# Patient Record
Sex: Female | Born: 2000 | Race: Black or African American | Hispanic: No | Smoking: Never smoker
Health system: Southern US, Community
[De-identification: ages and names within clinical notes are randomized; demographics above are authoritative.]

## PROBLEM LIST (undated history)

## (undated) DIAGNOSIS — L309 Dermatitis, unspecified: Secondary | ICD-10-CM

## (undated) DIAGNOSIS — F32A Depression, unspecified: Secondary | ICD-10-CM

## (undated) DIAGNOSIS — F909 Attention-deficit hyperactivity disorder, unspecified type: Secondary | ICD-10-CM

## (undated) DIAGNOSIS — T7840XA Allergy, unspecified, initial encounter: Secondary | ICD-10-CM

## (undated) DIAGNOSIS — F419 Anxiety disorder, unspecified: Secondary | ICD-10-CM

## (undated) HISTORY — DX: Dermatitis, unspecified: L30.9

## (undated) HISTORY — PX: CHOLECYSTECTOMY: SHX55

## (undated) HISTORY — DX: Allergy, unspecified, initial encounter: T78.40XA

---
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
## 2021-04-14 ENCOUNTER — Encounter: Payer: Self-pay | Admitting: Family Medicine

## 2021-04-18 MED ORDER — CETIRIZINE HCL 10 MG PO TABS: 10.0000 mg | ORAL_TABLET | Freq: Every day | ORAL | 3 refills | Status: DC

## 2021-05-20 ENCOUNTER — Encounter: Payer: Self-pay | Admitting: Family Medicine

## 2021-06-01 DIAGNOSIS — Z91018 Allergy to other foods: Secondary | ICD-10-CM | POA: Diagnosis not present

## 2021-06-01 DIAGNOSIS — R519 Headache, unspecified: Secondary | ICD-10-CM | POA: Diagnosis not present

## 2021-06-02 DIAGNOSIS — R519 Headache, unspecified: Secondary | ICD-10-CM | POA: Diagnosis not present

## 2021-06-04 ENCOUNTER — Telehealth: Payer: Self-pay

## 2021-06-04 NOTE — Telephone Encounter (Signed)
Transition Care Management Follow-up Telephone Call ?Date of discharge and from where: 06/02/2021 from [facility] ?How have you been since you were released from the hospital? Patient stated that she is feeling better. Patient did not have any questions or concerns at this time. Patient encouraged to reach out to PCP for further evaluation and possible treatment or referral for migraine concerns.  ?Any questions or concerns? No ? ?Items Reviewed: ?Did the pt receive and understand the discharge instructions provided? Yes  ?Medications obtained and verified? Yes  ?Other? No  ?Any new allergies since your discharge? No  ?Dietary orders reviewed? No ?Do you have support at home? Yes  ? ?Functional Questionnaire: (I = Independent and D = Dependent) ?ADLs: I ? ?Bathing/Dressing- I ? ?Meal Prep- I ? ?Eating- I ? ?Maintaining continence- I ? ?Transferring/Ambulation- I ? ?Managing Meds- I ? ? ?Follow up appointments reviewed: ? ?PCP Hospital f/u appt confirmed? No   ?Specialist Hospital f/u appt confirmed? No   ?Are transportation arrangements needed? No  ?If their condition worsens, is the pt aware to call PCP or go to the Emergency Dept.? Yes ?Was the patient provided with contact information for the PCP's office or ED? Yes ?Was to pt encouraged to call back with questions or concerns? Yes ? ?

## 2021-06-21 IMAGING — DX DG TMJ OPEN & CLOSE UNILATERAL*R*
2 series · 2 of 2 positions shown · non-contrast
Comparison: None.

CLINICAL DATA: TMJ pain for 13 years

EXAM:
RIGHT UNILATERAL TEMPOROMANDIBULAR JOINTS

[view not recorded (1 of 2)]
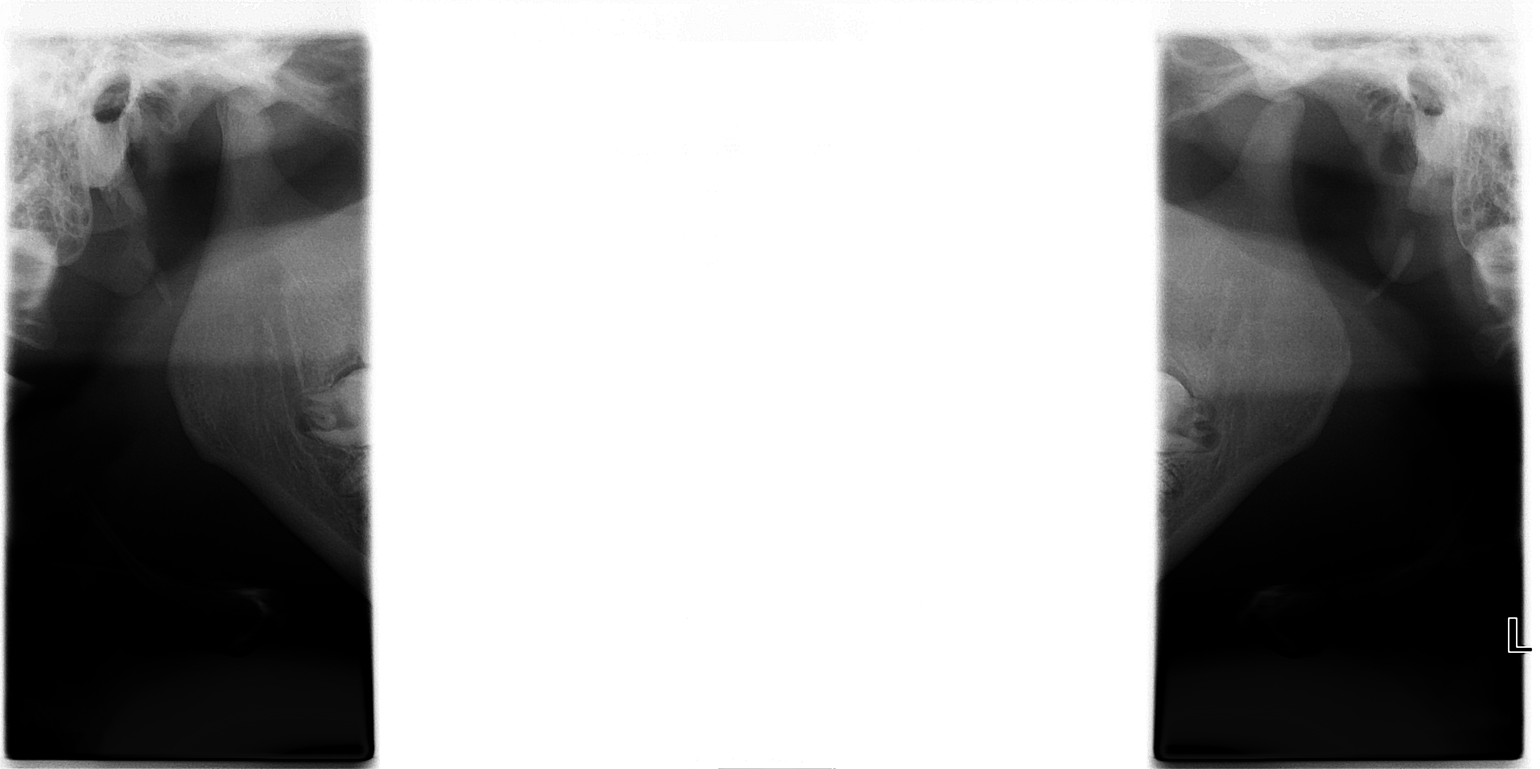

[view not recorded (2 of 2)]
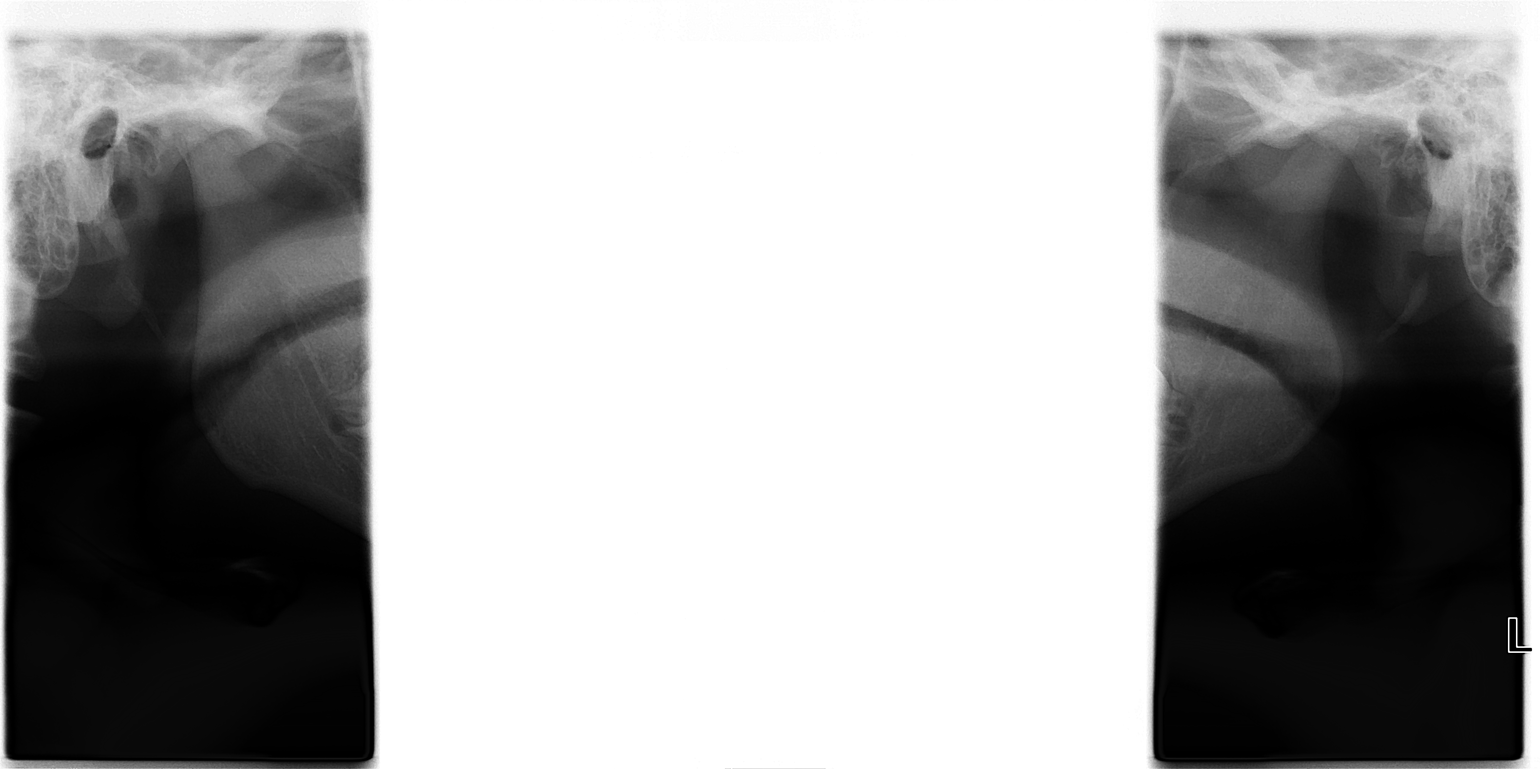

[2 of 2 positions shown; findings below may reference images not displayed]

FINDINGS: No acute fracture or dislocation. No aggressive osseous lesion.
Normal anterior translation of the mandibular condyle during mouth
opening. No hypertrophic changes. No bone destruction. No erosive
changes.
IMPRESSION: No acute osseous injury of the right temporomandibular joint.

## 2021-07-24 ENCOUNTER — Encounter: Payer: Self-pay | Admitting: *Deleted

## 2021-11-22 DIAGNOSIS — N760 Acute vaginitis: Secondary | ICD-10-CM | POA: Diagnosis not present

## 2021-11-22 DIAGNOSIS — Z23 Encounter for immunization: Secondary | ICD-10-CM | POA: Diagnosis not present

## 2021-11-29 DIAGNOSIS — R591 Generalized enlarged lymph nodes: Secondary | ICD-10-CM | POA: Diagnosis not present

## 2021-11-29 DIAGNOSIS — H6692 Otitis media, unspecified, left ear: Secondary | ICD-10-CM | POA: Diagnosis not present

## 2022-02-18 DIAGNOSIS — F329 Major depressive disorder, single episode, unspecified: Secondary | ICD-10-CM

## 2022-02-18 DIAGNOSIS — F411 Generalized anxiety disorder: Secondary | ICD-10-CM

## 2022-02-18 HISTORY — DX: Major depressive disorder, single episode, unspecified: F32.9

## 2022-02-18 HISTORY — DX: Generalized anxiety disorder: F41.1

## 2022-04-09 DIAGNOSIS — R051 Acute cough: Secondary | ICD-10-CM | POA: Diagnosis not present

## 2022-04-09 DIAGNOSIS — R509 Fever, unspecified: Secondary | ICD-10-CM | POA: Diagnosis not present

## 2022-04-09 DIAGNOSIS — J029 Acute pharyngitis, unspecified: Secondary | ICD-10-CM | POA: Diagnosis not present

## 2022-05-20 ENCOUNTER — Other Ambulatory Visit: Payer: Self-pay | Admitting: Family Medicine

## 2022-05-29 DIAGNOSIS — F331 Major depressive disorder, recurrent, moderate: Secondary | ICD-10-CM | POA: Diagnosis not present

## 2022-05-29 DIAGNOSIS — F900 Attention-deficit hyperactivity disorder, predominantly inattentive type: Secondary | ICD-10-CM | POA: Diagnosis not present

## 2022-05-29 DIAGNOSIS — F411 Generalized anxiety disorder: Secondary | ICD-10-CM | POA: Diagnosis not present

## 2022-07-04 ENCOUNTER — Encounter: Payer: Medicaid Other | Admitting: Family Medicine

## 2022-07-16 DIAGNOSIS — F900 Attention-deficit hyperactivity disorder, predominantly inattentive type: Secondary | ICD-10-CM | POA: Diagnosis not present

## 2022-07-16 DIAGNOSIS — F411 Generalized anxiety disorder: Secondary | ICD-10-CM | POA: Diagnosis not present

## 2022-07-16 DIAGNOSIS — F331 Major depressive disorder, recurrent, moderate: Secondary | ICD-10-CM | POA: Diagnosis not present

## 2022-07-17 DIAGNOSIS — N760 Acute vaginitis: Secondary | ICD-10-CM | POA: Diagnosis not present

## 2022-07-17 DIAGNOSIS — Z113 Encounter for screening for infections with a predominantly sexual mode of transmission: Secondary | ICD-10-CM | POA: Diagnosis not present

## 2022-07-17 DIAGNOSIS — Z114 Encounter for screening for human immunodeficiency virus [HIV]: Secondary | ICD-10-CM | POA: Diagnosis not present

## 2022-07-30 DIAGNOSIS — F331 Major depressive disorder, recurrent, moderate: Secondary | ICD-10-CM | POA: Diagnosis not present

## 2022-07-30 DIAGNOSIS — F411 Generalized anxiety disorder: Secondary | ICD-10-CM | POA: Diagnosis not present

## 2022-07-30 DIAGNOSIS — F900 Attention-deficit hyperactivity disorder, predominantly inattentive type: Secondary | ICD-10-CM | POA: Diagnosis not present

## 2022-08-09 DIAGNOSIS — F331 Major depressive disorder, recurrent, moderate: Secondary | ICD-10-CM | POA: Diagnosis not present

## 2022-08-09 DIAGNOSIS — F900 Attention-deficit hyperactivity disorder, predominantly inattentive type: Secondary | ICD-10-CM | POA: Diagnosis not present

## 2022-08-09 DIAGNOSIS — F411 Generalized anxiety disorder: Secondary | ICD-10-CM | POA: Diagnosis not present

## 2022-08-15 DIAGNOSIS — F331 Major depressive disorder, recurrent, moderate: Secondary | ICD-10-CM | POA: Diagnosis not present

## 2022-08-15 DIAGNOSIS — F411 Generalized anxiety disorder: Secondary | ICD-10-CM | POA: Diagnosis not present

## 2022-08-15 DIAGNOSIS — F900 Attention-deficit hyperactivity disorder, predominantly inattentive type: Secondary | ICD-10-CM | POA: Diagnosis not present

## 2022-08-29 ENCOUNTER — Ambulatory Visit
Admission: EM | Admit: 2022-08-29 | Discharge: 2022-08-29 | Disposition: A | Discharge status: Home / Self Care | Payer: Medicaid Other | Attending: Family Medicine | Admitting: Family Medicine

## 2022-08-29 ENCOUNTER — Telehealth (HOSPITAL_COMMUNITY): Payer: Self-pay

## 2022-08-29 DIAGNOSIS — H6693 Otitis media, unspecified, bilateral: Secondary | ICD-10-CM

## 2022-08-29 HISTORY — DX: Depression, unspecified: F32.A

## 2022-08-29 HISTORY — DX: Anxiety disorder, unspecified: F41.9

## 2022-08-29 HISTORY — DX: Attention-deficit hyperactivity disorder, unspecified type: F90.9

## 2022-08-29 NOTE — Discharge Instructions (Signed)
Take cefdinir 300 mg--2 capsules together daily for 7 days  You can take ibuprofen or Tylenol as needed for the pain.  Follow-up with your primary care, especially if this is not improving the next 2 to 3 days.

## 2022-08-29 NOTE — Telephone Encounter (Signed)
Patient called into the wrong urgent care. Patient states she will call over to [facility].

## 2022-08-29 NOTE — ED Triage Notes (Signed)
Patient presents to [facility] for bilateral ear pain since 6 days. States she flew to [place] causing ear pressure and worse today flying back. Took zyrtec, Flonase, and benadryl with no relief.

## 2022-08-29 NOTE — ED Provider Notes (Signed)
[facility]    CSN: [id number] Arrival date & time: 08/29/22  1720      History   Chief Complaint Chief Complaint  Patient presents with   Otalgia    HPI Sherry Griffin is a 22 y.o. female.    Otalgia Here for bilateral ear pain.  The right is worse than the left.  Symptoms began about 6 days ago right after she had flown to [place].  She flew back today and that is worse.  She is trying some Flonase.  No allergies to medications.  Last menstrual cycle was July 1. There is been just a little bit of itching and feeling of ear pressure on both sides.  Past Medical History:  Diagnosis Date   ADHD    Allergy    Anxiety    Depression    Eczema     Patient Active Problem List   Diagnosis Date Noted   Vaginal candidiasis 04/19/2020   Urinary frequency 03/13/2020   Vaginal odor 03/13/2020   Healthcare maintenance 03/13/2020   Contraception management 09/28/2019   Seasonal allergies 07/15/2019   Constipation 07/15/2019   Anxiety and depression 10/05/2018   Acid reflux 09/02/2017   Dysmenorrhea 09/02/2017   Nut allergy 12/05/2016   Eczema     Past Surgical History:  Procedure Laterality Date   CHOLECYSTECTOMY      OB History     Gravida  0   Para  0   Term  0   Preterm  0   AB  0   Living  0      SAB  0   IAB  0   Ectopic  0   Multiple  0   Live Births  0            Home Medications    Prior to Admission medications   Medication Sig Start Date End Date Taking? Authorizing Provider  amphetamine-dextroamphetamine (ADDERALL XR) 5 MG 24 hr capsule Take 5 mg by mouth daily.   Yes [provider]  Sertraline HCl (ZOLOFT PO) Take by mouth.   Yes [provider]  cetirizine (ZYRTEC) 10 MG tablet TAKE 1 TABLET BY MOUTH EVERY DAY 05/23/22   Latrelle Dodrill, MD  NONFORMULARY OR COMPOUNDED ITEM 4% amphotericin B vaginal cream, administer 5g nightly for 2 weeks, dispense 70g 09/29/20   Latrelle Dodrill, MD     Family History Family History  Problem Relation Age of Onset   Alcohol abuse Father    Cancer Maternal Grandmother        breast   Depression Maternal Grandmother        bipolar   Diabetes Maternal Grandfather    Hypertension Maternal Grandfather    Heart disease Paternal Grandfather    Hypertension Paternal Grandfather    Stroke Paternal Grandfather    Lupus Mother    Heart attack Mother    Obesity Mother    Heart disease Brother    Rheum arthritis Paternal Grandmother    Seizures Sister     Social History Social History   Tobacco Use   Smoking status: Passive Smoke Exposure - Never Smoker   Smokeless tobacco: Never   Tobacco comments:    mom smokes around her  Vaping Use   Vaping status: Never Used  Substance Use Topics   Alcohol use: Not Currently   Drug use: No     Allergies   Peanut-containing drug products and Shellfish allergy   Review of Systems  Review of Systems  HENT:  Positive for ear pain.      Physical Exam Triage Vital Signs ED Triage Vitals  Encounter Vitals Group     BP 08/29/22 1730 108/66     Systolic BP Percentile --      Diastolic BP Percentile --      Pulse Rate 08/29/22 1730 92     Resp 08/29/22 1730 16     Temp 08/29/22 1730 (!) 97.2 F (36.2 C)     Temp Source 08/29/22 1730 Oral     SpO2 08/29/22 1730 98 %     Weight --      Height --      Head Circumference --      Peak Flow --      Pain Score 08/29/22 1732 7     Pain Loc --      Pain Education --      Exclude from Growth Chart --    No data found.  Updated Vital Signs BP 108/66 (BP Location: Left Arm)   Pulse 92   Temp (!) 97.2 F (36.2 C) (Oral)   Resp 16   LMP 08/19/2022 (Approximate)   SpO2 98%   Visual Acuity Right Eye Distance:   Left Eye Distance:   Bilateral Distance:    Right Eye Near:   Left Eye Near:    Bilateral Near:     Physical Exam Vitals reviewed.  Constitutional:      General: She is not in acute distress.    Appearance: She  is not ill-appearing, toxic-appearing or diaphoretic.  HENT:     Ears:     Comments: Bilaterally tympanic membrane's are pink and dull.  There is no discharge in the canals no swelling of the canal walls    Nose: Nose normal.     Mouth/Throat:     Mouth: Mucous membranes are moist.  Eyes:     Extraocular Movements: Extraocular movements intact.     Pupils: Pupils are equal, round, and reactive to light.  Cardiovascular:     Rate and Rhythm: Normal rate and regular rhythm.     Heart sounds: No murmur heard. Pulmonary:     Effort: Pulmonary effort is normal.     Breath sounds: Normal breath sounds.  Musculoskeletal:     Cervical back: Neck supple.  Lymphadenopathy:     Cervical: No cervical adenopathy.  Skin:    Coloration: Skin is not jaundiced or pale.  Neurological:     General: No focal deficit present.     Mental Status: She is alert and oriented to person, place, and time.  Psychiatric:        Behavior: Behavior normal.      UC Treatments / Results  Labs (all labs ordered are listed, but only abnormal results are displayed) Labs Reviewed - No data to display  EKG   Radiology No results found.  Procedures Procedures (including critical care time)  Medications Ordered in UC Medications - No data to display  Initial Impression / Assessment and Plan / UC Course  I have reviewed the triage vital signs and the nursing notes.  Pertinent labs & imaging results that were available during my care of the patient were reviewed by me and considered in my medical decision making (see chart for details).        Omnicef is sent in for otitis media.  I want her to continue the Flonase.  I have asked her to follow-up with primary  care.  Final Clinical Impressions(s) / UC Diagnoses   Final diagnoses:  Bilateral otitis media, unspecified otitis media type     Discharge Instructions      Take cefdinir 300 mg--2 capsules together daily for 7 days  You can take  ibuprofen or Tylenol as needed for the pain.  Follow-up with your primary care, especially if this is not improving the next 2 to 3 days.     ED Prescriptions   None    PDMP not reviewed this encounter.   Zenia Resides, MD 08/29/22 [id number]

## 2022-08-30 ENCOUNTER — Telehealth: Payer: Self-pay | Admitting: Nurse Practitioner

## 2022-08-30 ENCOUNTER — Telehealth: Payer: Self-pay

## 2022-08-30 MED ORDER — CEFDINIR 300 MG PO CAPS: 300.0000 mg | ORAL_CAPSULE | Freq: Two times a day (BID) | ORAL | 0 refills | Status: DC

## 2022-08-30 MED ORDER — CEFDINIR 300 MG PO CAPS: 300.0000 mg | ORAL_CAPSULE | Freq: Two times a day (BID) | ORAL | 0 refills | Status: AC

## 2022-08-30 NOTE — Telephone Encounter (Signed)
Cefdinir sent to pharmacy per note 08/29/2022

## 2022-09-05 ENCOUNTER — Encounter: Payer: Self-pay | Admitting: Family Medicine

## 2022-09-05 ENCOUNTER — Other Ambulatory Visit: Payer: Self-pay

## 2022-09-05 ENCOUNTER — Ambulatory Visit (INDEPENDENT_AMBULATORY_CARE_PROVIDER_SITE_OTHER): Payer: Medicaid Other | Admitting: Family Medicine

## 2022-09-05 VITALS — BP 113/67 | HR 87 | Ht 65.0 in | Wt 139.0 lb

## 2022-09-05 DIAGNOSIS — F909 Attention-deficit hyperactivity disorder, unspecified type: Secondary | ICD-10-CM | POA: Insufficient documentation

## 2022-09-05 DIAGNOSIS — F900 Attention-deficit hyperactivity disorder, predominantly inattentive type: Secondary | ICD-10-CM | POA: Diagnosis not present

## 2022-09-05 DIAGNOSIS — F331 Major depressive disorder, recurrent, moderate: Secondary | ICD-10-CM | POA: Diagnosis not present

## 2022-09-05 DIAGNOSIS — F419 Anxiety disorder, unspecified: Secondary | ICD-10-CM | POA: Diagnosis not present

## 2022-09-05 DIAGNOSIS — F32A Depression, unspecified: Secondary | ICD-10-CM

## 2022-09-05 DIAGNOSIS — R634 Abnormal weight loss: Secondary | ICD-10-CM

## 2022-09-05 DIAGNOSIS — Z13220 Encounter for screening for lipoid disorders: Secondary | ICD-10-CM | POA: Diagnosis not present

## 2022-09-05 DIAGNOSIS — Z3009 Encounter for other general counseling and advice on contraception: Secondary | ICD-10-CM

## 2022-09-05 DIAGNOSIS — Z0000 Encounter for general adult medical examination without abnormal findings: Secondary | ICD-10-CM | POA: Diagnosis not present

## 2022-09-05 DIAGNOSIS — Z973 Presence of spectacles and contact lenses: Secondary | ICD-10-CM | POA: Diagnosis not present

## 2022-09-05 DIAGNOSIS — F411 Generalized anxiety disorder: Secondary | ICD-10-CM | POA: Diagnosis not present

## 2022-09-05 NOTE — Assessment & Plan Note (Signed)
Encouraged use of another form of birth control then condoms, given relatively high risk of failure rate of condoms.  Patient declines for now, she will consider.

## 2022-09-05 NOTE — Patient Instructions (Addendum)
It was great to see you again today.  Referring to eye doctor Checking labs Follow up in 1 year for next physical, sooner if needed  Be well, Dr. Pollie Meyer  Preventive Care 4-21 Years Old, Female Preventive care refers to lifestyle choices and visits with your health care provider that can promote health and wellness. At this stage in your life, you may start seeing a primary care physician instead of a pediatrician for your preventive care. Preventive care visits are also called wellness exams. What can I expect for my preventive care visit? Counseling During your preventive care visit, your health care provider may ask about your: Medical history, including: Past medical problems. Family medical history. Pregnancy history. Current health, including: Menstrual cycle. Method of birth control. Emotional well-being. Home life and relationship well-being. Sexual activity and sexual health. Lifestyle, including: Alcohol, nicotine or tobacco, and drug use. Access to firearms. Diet, exercise, and sleep habits. Sunscreen use. Motor vehicle safety. Physical exam Your health care provider may check your: Height and weight. These may be used to calculate your BMI (body mass index). BMI is a measurement that tells if you are at a healthy weight. Waist circumference. This measures the distance around your waistline. This measurement also tells if you are at a healthy weight and may help predict your risk of certain diseases, such as type 2 diabetes and high blood pressure. Heart rate and blood pressure. Body temperature. Skin for abnormal spots. Breasts. What immunizations do I need?  Vaccines are usually given at various ages, according to a schedule. Your health care provider will recommend vaccines for you based on your age, medical history, and lifestyle or other factors, such as travel or where you work. What tests do I need? Screening Your health care provider may recommend  screening tests for certain conditions. This may include: Vision and hearing tests. Lipid and cholesterol levels. Pelvic exam and Pap test. Hepatitis B test. Hepatitis C test. HIV (human immunodeficiency virus) test. STI (sexually transmitted infection) testing, if you are at risk. Tuberculosis skin test if you have symptoms. BRCA-related cancer screening. This may be done if you have a family history of breast, ovarian, tubal, or peritoneal cancers. Talk with your health care provider about your test results, treatment options, and if necessary, the need for more tests. Follow these instructions at home: Eating and drinking  Eat a healthy diet that includes fresh fruits and vegetables, whole grains, lean protein, and low-fat dairy products. Drink enough fluid to keep your urine pale yellow. Do not drink alcohol if: Your health care provider tells you not to drink. You are pregnant, may be pregnant, or are planning to become pregnant. You are under the legal drinking age. In the [place] the legal drinking age is 69. If you drink alcohol: Limit how much you have to 0-1 drink a day. Know how much alcohol is in your drink. In the [place] one drink equals one 12 oz bottle of beer (355 mL), one 5 oz glass of wine (148 mL), or one 1 oz glass of hard liquor (44 mL). Lifestyle Brush your teeth every morning and night with fluoride toothpaste. Floss one time each day. Exercise for at least 30 minutes 5 or more days of the week. Do not use any products that contain nicotine or tobacco. These products include cigarettes, chewing tobacco, and vaping devices, such as e-cigarettes. If you need help quitting, ask your health care provider. Do not use drugs. If you are sexually active, practice safe  sex. Use a condom or other form of protection to prevent STIs. If you do not wish to become pregnant, use a form of birth control. If you plan to become pregnant, see your health care provider for a  prepregnancy visit. Find healthy ways to manage stress, such as: Meditation, yoga, or listening to music. Journaling. Talking to a trusted person. Spending time with friends and family. Safety Always wear your seat belt while driving or riding in a vehicle. Do not drive: If you have been drinking alcohol. Do not ride with someone who has been drinking. When you are tired or distracted. While texting. If you have been using any mind-altering substances or drugs. Wear a helmet and other protective equipment during sports activities. If you have firearms in your house, make sure you follow all gun safety procedures. Seek help if you have been bullied, physically abused, or sexually abused. Use the internet responsibly to avoid dangers, such as online bullying and online sex predators. What's next? Go to your health care provider once a year for an annual wellness visit. Ask your health care provider how often you should have your eyes and teeth checked. Stay up to date on all vaccines. This information is not intended to replace advice given to you by your health care provider. Make sure you discuss any questions you have with your health care provider. Document Revised: 08/02/2020 Document Reviewed: 08/02/2020 Elsevier Patient Education  2024 [employer].

## 2022-09-05 NOTE — Progress Notes (Signed)
  Date of Visit: 09/05/2022   SUBJECTIVE:   HPI:  Sherry Griffin presents today for a well woman exam.   Concerns today: wants thyroid tested, suggested by her psychiatrist to have this done Periods: Monthly periods, no issues Contraception: Condoms every time, 1 female partner, does not want any other forms of birth control right now Pelvic symptoms: No concerns STD Screening: Declines concerns or testing today Pap smear status: Will be due in December after she turns 22, [facility] Exercise: Walks daily Smoking: No Alcohol: No Drugs: No Mood: Diagnosed with depression and anxiety, sees psychiatrist, taking sertraline and Adderall, doing well with this regimen Cancers in family: Both maternal and paternal grandmothers with breast cancer, unknown if had any genetic testing done.  Paternal grandfather with prostate cancer.  Maternal grandfather with unknown cancer.  OBJECTIVE:   BP 113/67   Pulse 87   Ht 5\' 5"  (1.651 m)   Wt 139 lb (63 kg)   LMP 08/19/2022 (Approximate)   SpO2 99%   BMI 23.13 kg/m  Gen: NAD, pleasant, cooperative HEENT: NCAT, PERRL, no palpable thyromegaly or anterior cervical lymphadenopathy Heart: RRR, no murmurs Lungs: CTAB, NWOB Abdomen: soft, nontender to palpation Neuro: grossly nonfocal, speech normal  ASSESSMENT/PLAN:   Health maintenance:  -STD screening: Declines today -pap smear: Due in December, plans to get done at health services at [facility] -lipid screening: Check lipids today along with CMET -handout given on health maintenance topics  Contraception management Encouraged use of another form of birth control then condoms, given relatively high risk of failure rate of condoms.  Patient declines for now, she will consider.  Anxiety and depression Doing well on current regimen of sertraline and Adderall.  Reported some weight loss with Adderall, psychiatrist recommended having TSH checked.  Check TSH and free T4  today.  Request referral to get new glasses.  Referral placed.  FOLLOW UP: Follow up in 1 year for next CPE  [place] J. Pollie Meyer, MD [facility]

## 2022-09-05 NOTE — Assessment & Plan Note (Signed)
Doing well on current regimen of sertraline and Adderall.

## 2022-09-06 LAB — CMP14+EGFR
ALT: 9 IU/L (ref 0–32)
AST: 13 IU/L (ref 0–40)
Albumin: 4.3 g/dL (ref 4.0–5.0)
Alkaline Phosphatase: 54 IU/L (ref 42–106)
BUN/Creatinine Ratio: 18 (ref 9–23)
BUN: 12 mg/dL (ref 6–20)
Bilirubin Total: 0.6 mg/dL (ref 0.0–1.2)
CO2: 20 mmol/L (ref 20–29)
Calcium: 9.4 mg/dL (ref 8.7–10.2)
Chloride: 102 mmol/L (ref 96–106)
Creatinine, Ser: 0.68 mg/dL (ref 0.57–1.00)
Globulin, Total: 2.6 g/dL (ref 1.5–4.5)
Glucose: 78 mg/dL (ref 70–99)
Potassium: 3.8 mmol/L (ref 3.5–5.2)
Sodium: 138 mmol/L (ref 134–144)
Total Protein: 6.9 g/dL (ref 6.0–8.5)
eGFR: 128 mL/min/{1.73_m2} (ref >59)

## 2022-09-06 LAB — LIPID PANEL
Chol/HDL Ratio: 3.3 ratio (ref 0.0–4.4)
Cholesterol, Total: 156 mg/dL (ref 100–199)
HDL: 48 mg/dL (ref >39)
LDL Chol Calc (NIH): 98 mg/dL (ref 0–99)
Triglycerides: 48 mg/dL (ref 0–149)
VLDL Cholesterol Cal: 10 mg/dL (ref 5–40)

## 2022-09-06 LAB — TSH: TSH: 1.92 u[IU]/mL (ref 0.450–4.500)

## 2022-09-06 LAB — T4, FREE: Free T4: 1.18 ng/dL (ref 0.82–1.77)

## 2022-09-16 DIAGNOSIS — F411 Generalized anxiety disorder: Secondary | ICD-10-CM | POA: Diagnosis not present

## 2022-09-16 DIAGNOSIS — F900 Attention-deficit hyperactivity disorder, predominantly inattentive type: Secondary | ICD-10-CM | POA: Diagnosis not present

## 2022-09-16 DIAGNOSIS — F331 Major depressive disorder, recurrent, moderate: Secondary | ICD-10-CM | POA: Diagnosis not present

## 2022-09-17 DIAGNOSIS — F411 Generalized anxiety disorder: Secondary | ICD-10-CM | POA: Diagnosis not present

## 2022-09-17 DIAGNOSIS — F900 Attention-deficit hyperactivity disorder, predominantly inattentive type: Secondary | ICD-10-CM | POA: Diagnosis not present

## 2022-09-17 DIAGNOSIS — F331 Major depressive disorder, recurrent, moderate: Secondary | ICD-10-CM | POA: Diagnosis not present

## 2022-09-23 ENCOUNTER — Encounter: Payer: Self-pay | Admitting: Family Medicine

## 2022-09-23 DIAGNOSIS — L659 Nonscarring hair loss, unspecified: Secondary | ICD-10-CM

## 2022-09-24 ENCOUNTER — Telehealth: Payer: Self-pay

## 2022-09-24 NOTE — Telephone Encounter (Signed)
[facility] [address],  [place], [place] [address]  [phone] Fax [phone]   They will call pt to schedule appt. Clemencia Course, CMA

## 2022-09-24 NOTE — Telephone Encounter (Signed)
Patient calls nurse line regarding referral to eye doctor.   She is requesting that referral be faxed to [facility].   She provided me with the following fax number: [phone].  Will forward to referral coordinator.   Veronda Prude, RN

## 2022-09-27 NOTE — Telephone Encounter (Signed)
Referral entered Brittany J McIntyre, MD  

## 2022-10-10 ENCOUNTER — Ambulatory Visit: Payer: Medicaid Other | Admitting: Family Medicine

## 2022-10-25 DIAGNOSIS — F900 Attention-deficit hyperactivity disorder, predominantly inattentive type: Secondary | ICD-10-CM | POA: Diagnosis not present

## 2022-10-25 DIAGNOSIS — F411 Generalized anxiety disorder: Secondary | ICD-10-CM | POA: Diagnosis not present

## 2022-10-25 DIAGNOSIS — F331 Major depressive disorder, recurrent, moderate: Secondary | ICD-10-CM | POA: Diagnosis not present

## 2022-11-18 ENCOUNTER — Ambulatory Visit: Payer: Medicaid Other | Admitting: Family Medicine

## 2022-11-18 ENCOUNTER — Other Ambulatory Visit: Payer: Self-pay

## 2022-11-18 ENCOUNTER — Encounter: Payer: Self-pay | Admitting: Family Medicine

## 2022-11-18 VITALS — BP 108/79 | HR 80 | Ht 65.0 in | Wt 138.6 lb

## 2022-11-18 DIAGNOSIS — Z23 Encounter for immunization: Secondary | ICD-10-CM

## 2022-11-18 DIAGNOSIS — H9201 Otalgia, right ear: Secondary | ICD-10-CM

## 2022-11-18 MED ORDER — CIPROFLOXACIN-DEXAMETHASONE 0.3-0.1 % OT SUSP: 4.0000 [drp] | Freq: Two times a day (BID) | OTIC | 0 refills | Status: DC

## 2022-11-18 NOTE — Assessment & Plan Note (Addendum)
Normal appearing TM, so less likely AOM. Erythema appreciated in outer ear canal. Given PE and reported pain, suspect otitis externa. Ciprodex to be sent to pharmacy.

## 2022-11-18 NOTE — Patient Instructions (Signed)
It was great to see you again today.  Sent in hear drops - twice a day for 7 days into R ear  Be well, Dr. Pollie Meyer  Otitis Externa  Otitis externa is an infection of the outer ear canal. The outer ear canal is the area between the outside of the ear and the eardrum. Otitis externa is sometimes called swimmer's ear. What are the causes? Common causes of this condition include: Swimming in dirty water. Moisture in the ear. An injury to the inside of the ear. An object stuck in the ear. A cut or scrape on the outside of the ear or in the ear canal. What increases the risk? You are more likely to develop this condition if you go swimming often. What are the signs or symptoms? The first symptom of this condition is often itching in the ear. Later symptoms of the condition include: Swelling of the ear. Redness in the ear. Ear pain. The pain may get worse when you pull on your ear. Pus coming from the ear. How is this diagnosed? This condition may be diagnosed by examining the ear and testing fluid from the ear for bacteria and funguses. How is this treated? This condition may be treated with: Antibiotic ear drops. These are often given for 10-14 days. Medicines to reduce itching and swelling. Follow these instructions at home: If you were prescribed antibiotic ear drops, use them as told by your health care provider. Do not stop using the antibiotic even if you start to feel better. Take over-the-counter and prescription medicines only as told by your health care provider. Avoid getting water in your ears as told by your health care provider. This may include avoiding swimming or water sports for a few days. Keep all follow-up visits. This is important. How is this prevented? Keep your ears dry. Use the corner of a towel to dry your ears after you swim or bathe. Avoid scratching or putting things in your ear. Doing these things can damage the ear canal or remove the protective wax  that lines it, which makes it easier for bacteria and funguses to grow. Avoid swimming in lakes, polluted water, or swimming pools that may not have enough chlorine. Contact a health care provider if: You have a fever. Your ear is still red, swollen, painful, or draining pus after 3 days. Your redness, swelling, or pain gets worse. You have a severe headache. Get help right away if: You have redness, swelling, and pain or tenderness in the area behind your ear. Summary Otitis externa is an infection of the outer ear canal. Common causes include swimming in dirty water, moisture in the ear, or a cut or scrape in the ear. Symptoms include pain, redness, and swelling of the ear canal. If you were prescribed antibiotic ear drops, use them as told by your health care provider. Do not stop using the antibiotic even if you start to feel better. This information is not intended to replace advice given to you by your health care provider. Make sure you discuss any questions you have with your health care provider. Document Revised: 04/19/2020 Document Reviewed: 04/19/2020 Elsevier Patient Education  2024 [employer].

## 2022-11-18 NOTE — Progress Notes (Signed)
   SUBJECTIVE:   CHIEF COMPLAINT / HPI:   Sherry Griffin is a 22 y.o. female presenting for ear pain.  R ear pain: Patient reports flying to [place] early July where she experienced ear pain on the way back. Reports she went to an Urgent Care around 7/11-7/12 and was diagnosed with OM and prescribed Cefdinir 300 mg BID x 7 days. She reports no symptoms for about 2 weeks and then started experiencing intermittent ear pain, pressure and fluid coming out of R ear. Denies fevers, weight loss, night sweat; endorses no change in eating and drinking.   Additionally, patient reports her Adderall was increased to 15 mg tablet daily. Seeing psych for this.  PERTINENT  PMH / PSH: ADHD  OBJECTIVE:   BP 108/79   Pulse 80   Ht 5\' 5"  (1.651 m)   Wt 138 lb 9.6 oz (62.9 kg)   SpO2 100%   BMI 23.06 kg/m    General: NAD, pleasant, cooperative HEENT: Erythematous R outer ear canal. No bulging TM appreciated on L or R. No tonsillar erythema. No cervical lymphadenopathy. Respiratory: normal work of breathing on room air Psych: normal range of affect, well groomed, speech normal in rate and volume, normal eye contact   ASSESSMENT/PLAN:   Acute ear pain, right Normal appearing TM, so less likely AOM. Erythema appreciated in outer ear canal. Given PE and reported pain, suspect otitis externa. Ciprodex to be sent to pharmacy.   Health Maintenance: - Flu vaccine offered; given today - COVID-19 vaccine offered; given today  FOLLOW-UP: Patient to follow up as needed.   Bubba Hales, Medical Student [facility]  Patient seen along with medical student Filomena Jungling. I personally evaluated this patient along with the student, and verified all aspects of the history, physical exam, and medical decision making as documented by the student. I agree with the student's documentation and have made all necessary edits.  Levert Feinstein, MD  [facility]

## 2022-11-29 ENCOUNTER — Other Ambulatory Visit: Payer: Self-pay | Admitting: Family Medicine

## 2022-12-04 DIAGNOSIS — F331 Major depressive disorder, recurrent, moderate: Secondary | ICD-10-CM | POA: Diagnosis not present

## 2022-12-04 DIAGNOSIS — F900 Attention-deficit hyperactivity disorder, predominantly inattentive type: Secondary | ICD-10-CM | POA: Diagnosis not present

## 2022-12-04 DIAGNOSIS — F411 Generalized anxiety disorder: Secondary | ICD-10-CM | POA: Diagnosis not present

## 2022-12-16 DIAGNOSIS — F411 Generalized anxiety disorder: Secondary | ICD-10-CM | POA: Diagnosis not present

## 2022-12-16 DIAGNOSIS — F900 Attention-deficit hyperactivity disorder, predominantly inattentive type: Secondary | ICD-10-CM | POA: Diagnosis not present

## 2022-12-16 DIAGNOSIS — F331 Major depressive disorder, recurrent, moderate: Secondary | ICD-10-CM | POA: Diagnosis not present

## 2022-12-30 DIAGNOSIS — F411 Generalized anxiety disorder: Secondary | ICD-10-CM | POA: Diagnosis not present

## 2022-12-30 DIAGNOSIS — F900 Attention-deficit hyperactivity disorder, predominantly inattentive type: Secondary | ICD-10-CM | POA: Diagnosis not present

## 2022-12-30 DIAGNOSIS — F331 Major depressive disorder, recurrent, moderate: Secondary | ICD-10-CM | POA: Diagnosis not present

## 2023-01-03 DIAGNOSIS — F900 Attention-deficit hyperactivity disorder, predominantly inattentive type: Secondary | ICD-10-CM | POA: Diagnosis not present

## 2023-01-03 DIAGNOSIS — F411 Generalized anxiety disorder: Secondary | ICD-10-CM | POA: Diagnosis not present

## 2023-01-03 DIAGNOSIS — F331 Major depressive disorder, recurrent, moderate: Secondary | ICD-10-CM | POA: Diagnosis not present

## 2023-01-06 DIAGNOSIS — F900 Attention-deficit hyperactivity disorder, predominantly inattentive type: Secondary | ICD-10-CM | POA: Diagnosis not present

## 2023-01-06 DIAGNOSIS — F411 Generalized anxiety disorder: Secondary | ICD-10-CM | POA: Diagnosis not present

## 2023-01-06 DIAGNOSIS — F331 Major depressive disorder, recurrent, moderate: Secondary | ICD-10-CM | POA: Diagnosis not present

## 2023-01-13 DIAGNOSIS — F331 Major depressive disorder, recurrent, moderate: Secondary | ICD-10-CM | POA: Diagnosis not present

## 2023-01-13 DIAGNOSIS — F411 Generalized anxiety disorder: Secondary | ICD-10-CM | POA: Diagnosis not present

## 2023-01-13 DIAGNOSIS — F900 Attention-deficit hyperactivity disorder, predominantly inattentive type: Secondary | ICD-10-CM | POA: Diagnosis not present

## 2023-01-22 NOTE — Progress Notes (Signed)
Office Visit Note  Patient: Sherry Griffin             Date of Birth: 29-Mar-2000           MRN: [id number]             PCP: Latrelle Dodrill, MD Referring: Latrelle Dodrill, MD Visit Date: 02/05/2023 Occupation: @GUAROCC @  Subjective:  Pain in multiple joints  History of Present Illness: Sherry Griffin is a 22 y.o. female returns today after her last visit in April 2022.  She states the hip pain she was experiencing before has resolved.  She has occasional right TMJ discomfort.  Recently she has been experiencing pain and discomfort in her neck, upper back and her bilateral hands.  Occasionally in her lower back and her knee joints.  She notices some swelling in her hands.  She has been also experiencing morning stiffness.  Patient reports patchy hair loss on her scalp.  She states that hair are growing back now.  She has an appointment pending with the dermatologist.    Activities of Daily Living:  Patient reports morning stiffness for 1.5 hours.   Patient Reports nocturnal pain.  Difficulty dressing/grooming: Reports Difficulty climbing stairs: Denies Difficulty getting out of chair: Denies Difficulty using hands for taps, buttons, cutlery, and/or writing: Reports  Review of Systems  Constitutional:  Negative for fatigue.  HENT:  Negative for mouth sores and mouth dryness.   Eyes:  Negative for dryness.  Respiratory:  Negative for shortness of breath.   Cardiovascular:  Negative for chest pain and palpitations.  Gastrointestinal:  Negative for blood in stool, constipation and diarrhea.  Endocrine: Negative for increased urination.  Genitourinary:  Negative for involuntary urination.  Musculoskeletal:  Positive for joint pain, joint pain, joint swelling, myalgias, morning stiffness, muscle tenderness and myalgias. Negative for gait problem and muscle weakness.  Skin:  Positive for hair loss and sensitivity to sunlight. Negative for color change and rash.       Patient  reports patchy hair loss  Allergic/Immunologic: Negative for susceptible to infections.  Neurological:  Negative for dizziness and headaches.  Hematological:  Negative for swollen glands.  Psychiatric/Behavioral:  Positive for sleep disturbance. Negative for depressed mood. The patient is not nervous/anxious.     PMFS History:  Patient Active Problem List   Diagnosis Date Noted   ADHD 09/05/2022   Vaginal candidiasis 04/19/2020   Contraception management 09/28/2019   Seasonal allergies 07/15/2019   Constipation 07/15/2019   Anxiety and depression 10/05/2018   Acid reflux 09/02/2017   Dysmenorrhea 09/02/2017   Nut allergy 12/05/2016   Acute ear pain, right 05/26/2012   Eczema     Past Medical History:  Diagnosis Date   ADHD    Allergy    Anxiety    Depression    Eczema    GAD (generalized anxiety disorder) 2024   per patient   MDD (major depressive disorder) 2024   per patient    Family History  Problem Relation Age of Onset   Lupus Mother    Heart attack Mother    Obesity Mother    Alcohol abuse Father    Seizures Sister    Heart disease Brother    Cancer Maternal Grandmother        breast   Depression Maternal Grandmother        bipolar   Cancer Maternal Grandfather        Unknown type   Diabetes Maternal Grandfather  Hypertension Maternal Grandfather    Cancer Paternal Grandmother        Breast   Rheum arthritis Paternal Grandmother    Cancer Paternal Grandfather        Prostate   Heart disease Paternal Grandfather    Hypertension Paternal Grandfather    Stroke Paternal Grandfather    Past Surgical History:  Procedure Laterality Date   WISDOM TOOTH EXTRACTION  02/04/2023   x4   Social History   Social History Narrative   Mother recently regained custody from father on 5/12. Mother and father now divorced.  Lives with younger sister and mother   Immunization History  Administered Date(s) Administered   DTaP / IPV 07/09/2007   HPV 9-valent  10/03/2015   HPV Quadrivalent 09/09/2014   Hepatitis A 07/09/2007   Influenza, Seasonal, Injecte, Preservative Fre 11/18/2022   Influenza,inj,Quad PF,6+ Mos 12/09/2012, 12/05/2016, 03/16/2018, 12/17/2019   MMR 07/09/2007   Meningococcal Conjugate 09/09/2014   Meningococcal Mcv4o 12/17/2019   PFIZER Comirnaty(Gray Top)Covid-19 Tri-Sucrose Vaccine 03/13/2020   PFIZER(Purple Top)SARS-COV-2 Vaccination 08/26/2019, 09/15/2019   Pfizer(Comirnaty)Fall Seasonal Vaccine 12 years and older 11/18/2022   Tdap 09/09/2014   Varicella 07/09/2007     Objective: Vital Signs: BP 110/73 (BP Location: Left Arm, Patient Position: Sitting, Cuff Size: Normal)   Pulse 71   Resp 14   Ht 5\' 5"  (1.651 m)   Wt 140 lb (63.5 kg)   BMI 23.30 kg/m    Physical Exam Vitals and nursing note reviewed.  Constitutional:      Appearance: She is well-developed.  HENT:     Head: Normocephalic and atraumatic.  Eyes:     Conjunctiva/sclera: Conjunctivae normal.  Cardiovascular:     Rate and Rhythm: Normal rate and regular rhythm.     Heart sounds: Normal heart sounds.  Pulmonary:     Effort: Pulmonary effort is normal.     Breath sounds: Normal breath sounds.  Abdominal:     General: Bowel sounds are normal.     Palpations: Abdomen is soft.  Musculoskeletal:     Cervical back: Normal range of motion.  Lymphadenopathy:     Cervical: No cervical adenopathy.  Skin:    General: Skin is warm and dry.     Capillary Refill: Capillary refill takes less than 2 seconds.  Neurological:     Mental Status: She is alert and oriented to person, place, and time.  Psychiatric:        Behavior: Behavior normal.      Musculoskeletal Exam: Cervical, thoracic and lumbar spine were in good range of motion.  She had no point tenderness.  Shoulders, elbows, wrist joints, MCPs PIPs and DIPs been good range of motion with no synovitis.  No tenderness was noted on the examination.  Hip joints were in good range of motion without  tenderness over trochanteric region.  Knee joints with good range of motion without any warmth swelling or effusion.  There was no tenderness over ankles or MTPs.  CDAI Exam: CDAI Score: -- Patient Global: --; Provider Global: -- Swollen: --; Tender: -- Joint Exam 02/05/2023   No joint exam has been documented for this visit   There is currently no information documented on the homunculus. Go to the Rheumatology activity and complete the homunculus joint exam.  Investigation: No additional findings.  Imaging: No results found.  Recent Labs: Lab Results  Component Value Date   WBC 9.1 07/13/2019   HGB 13.5 07/13/2019   PLT 391 07/13/2019   NA  138 09/05/2022   K 3.8 09/05/2022   CL 102 09/05/2022   CO2 20 09/05/2022   GLUCOSE 78 09/05/2022   BUN 12 09/05/2022   CREATININE 0.68 09/05/2022   BILITOT 0.6 09/05/2022   ALKPHOS 54 09/05/2022   AST 13 09/05/2022   ALT 9 09/05/2022   PROT 6.9 09/05/2022   ALBUMIN 4.3 09/05/2022   CALCIUM 9.4 09/05/2022   GFRAA CANCELED 07/13/2019    Speciality Comments: No specialty comments available.  Procedures:  No procedures performed Allergies: Peanut-containing drug products and Shellfish allergy   Assessment / Plan:     Visit Diagnoses: Pain in both hands -patient reports having a recent episode of increased joint swelling and discomfort.  No synovitis was noted on the examination.  Patient had no tenderness on the examination.  I advised her to take pictures of her hands in the future if she develops swelling.  She should also contact us.  She is at [facility] and it is difficult for her to commute.  I will obtain labs today.  All the serology has been negative in the past.  Plan: Sedimentation rate, Rheumatoid factor, Cyclic citrul peptide antibody, IgG, ANA.  A handout on hand exercises was given.  Patient wants to return on a as needed basis.  She will contact us if she has recurrence of symptoms.  Will contact her once the lab results  are available.  Chronic pain of both hips -complains of discomfort in her hips.  There was no discomfort with range of motion today.  She had no tenderness over trochanteric region.  Neck pain-she complains of neck stiffness and discomfort.  She also had some discomfort in the trapezius region.  A handout on neck exercises was given.  Pain in thoracic spine-she can place of intermittent discomfort in the thoracic region.  She had no point tenderness in the thoracic region.  She had good mobility.  A handout on upper back exercises was given.  TMJ tenderness, right - Referred by Dr. Barbette Merino TMD. .  X-ray of the right TMJ read by Dr. Jena Gauss was unremarkable.  All autoimmune work-up was negative.  Family history of rheumatoid arthritis - Paternal grandmother  History of eczema  Anxiety and depression  History of gastroesophageal reflux (GERD)  Seasonal allergies  Orders: Orders Placed This Encounter  Procedures   Sedimentation rate   Rheumatoid factor   Cyclic citrul peptide antibody, IgG   ANA   No orders of the defined types were placed in this encounter.    Follow-Up Instructions: Return if symptoms worsen or fail to improve, for Pain in joints.   Pollyann Savoy, MD  Note - This record has been created using Animal nutritionist.  Chart creation errors have been sought, but may not always  have been located. Such creation errors do not reflect on  the standard of medical care.

## 2023-02-04 HISTORY — PX: WISDOM TOOTH EXTRACTION: SHX21

## 2023-02-05 ENCOUNTER — Ambulatory Visit: Payer: Medicaid Other | Attending: Rheumatology | Admitting: Rheumatology

## 2023-02-05 ENCOUNTER — Encounter: Payer: Self-pay | Admitting: Rheumatology

## 2023-02-05 VITALS — BP 110/73 | HR 71 | Resp 14 | Ht 65.0 in | Wt 140.0 lb

## 2023-02-05 DIAGNOSIS — G8929 Other chronic pain: Secondary | ICD-10-CM

## 2023-02-05 DIAGNOSIS — M546 Pain in thoracic spine: Secondary | ICD-10-CM | POA: Diagnosis not present

## 2023-02-05 DIAGNOSIS — Z8719 Personal history of other diseases of the digestive system: Secondary | ICD-10-CM | POA: Diagnosis not present

## 2023-02-05 DIAGNOSIS — Z8261 Family history of arthritis: Secondary | ICD-10-CM | POA: Diagnosis not present

## 2023-02-05 DIAGNOSIS — F32A Depression, unspecified: Secondary | ICD-10-CM

## 2023-02-05 DIAGNOSIS — M25552 Pain in left hip: Secondary | ICD-10-CM | POA: Diagnosis not present

## 2023-02-05 DIAGNOSIS — M79642 Pain in left hand: Secondary | ICD-10-CM | POA: Diagnosis not present

## 2023-02-05 DIAGNOSIS — F419 Anxiety disorder, unspecified: Secondary | ICD-10-CM

## 2023-02-05 DIAGNOSIS — Z872 Personal history of diseases of the skin and subcutaneous tissue: Secondary | ICD-10-CM

## 2023-02-05 DIAGNOSIS — M542 Cervicalgia: Secondary | ICD-10-CM

## 2023-02-05 DIAGNOSIS — J302 Other seasonal allergic rhinitis: Secondary | ICD-10-CM | POA: Diagnosis not present

## 2023-02-05 DIAGNOSIS — M25551 Pain in right hip: Secondary | ICD-10-CM

## 2023-02-05 DIAGNOSIS — M79641 Pain in right hand: Secondary | ICD-10-CM

## 2023-02-05 DIAGNOSIS — M26621 Arthralgia of right temporomandibular joint: Secondary | ICD-10-CM | POA: Diagnosis not present

## 2023-02-05 NOTE — Patient Instructions (Signed)
Hand Exercises Hand exercises can be helpful for almost anyone. They can strengthen your hands and improve flexibility and movement. The exercises can also increase blood flow to the hands. These results can make your work and daily tasks easier for you. Hand exercises can be especially helpful for people who have joint pain from arthritis or nerve damage from using their hands over and over. These exercises can also help people who injure a hand. Exercises Most of these hand exercises are gentle stretching and motion exercises. It is usually safe to do them often throughout the day. Warming up your hands before exercise may help reduce stiffness. You can do this with gentle massage or by placing your hands in warm water for 10-15 minutes. It is normal to feel some stretching, pulling, tightness, or mild discomfort when you begin new exercises. In time, this will improve. Remember to always be careful and stop right away if you feel sudden, very bad pain or your pain gets worse. You want to get better and be safe. Ask your health care provider which exercises are safe for you. Do exercises exactly as told by your provider and adjust them as told. Do not begin these exercises until told by your provider. Knuckle bend or "claw" fist  Stand or sit with your arm, hand, and all five fingers pointed straight up. Make sure to keep your wrist straight. Gently bend your fingers down toward your palm until the tips of your fingers are touching your palm. Keep your big knuckle straight and only bend the small knuckles in your fingers. Hold this position for 10 seconds. Straighten your fingers back to your starting position. Repeat this exercise 5-10 times with each hand. Full finger fist  Stand or sit with your arm, hand, and all five fingers pointed straight up. Make sure to keep your wrist straight. Gently bend your fingers into your palm until the tips of your fingers are touching the middle of your  palm. Hold this position for 10 seconds. Extend your fingers back to your starting position, stretching every joint fully. Repeat this exercise 5-10 times with each hand. Straight fist  Stand or sit with your arm, hand, and all five fingers pointed straight up. Make sure to keep your wrist straight. Gently bend your fingers at the big knuckle, where your fingers meet your hand, and at the middle knuckle. Keep the knuckle at the tips of your fingers straight and try to touch the bottom of your palm. Hold this position for 10 seconds. Extend your fingers back to your starting position, stretching every joint fully. Repeat this exercise 5-10 times with each hand. Tabletop  Stand or sit with your arm, hand, and all five fingers pointed straight up. Make sure to keep your wrist straight. Gently bend your fingers at the big knuckle, where your fingers meet your hand, as far down as you can. Keep the small knuckles in your fingers straight. Think of forming a tabletop with your fingers. Hold this position for 10 seconds. Extend your fingers back to your starting position, stretching every joint fully. Repeat this exercise 5-10 times with each hand. Finger spread  Place your hand flat on a table with your palm facing down. Make sure your wrist stays straight. Spread your fingers and thumb apart from each other as far as you can until you feel a gentle stretch. Hold this position for 10 seconds. Bring your fingers and thumb tight together again. Hold this position for 10 seconds. Repeat  this exercise 5-10 times with each hand. Making circles  Stand or sit with your arm, hand, and all five fingers pointed straight up. Make sure to keep your wrist straight. Make a circle by touching the tip of your thumb to the tip of your index finger. Hold for 10 seconds. Then open your hand wide. Repeat this motion with your thumb and each of your fingers. Repeat this exercise 5-10 times with each hand. Thumb  motion  Sit with your forearm resting on a table and your wrist straight. Your thumb should be facing up toward the ceiling. Keep your fingers relaxed as you move your thumb. Lift your thumb up as high as you can toward the ceiling. Hold for 10 seconds. Bend your thumb across your palm as far as you can, reaching the tip of your thumb for the small finger (pinkie) side of your palm. Hold for 10 seconds. Repeat this exercise 5-10 times with each hand. Grip strengthening  Hold a stress ball or other soft ball in the middle of your hand. Slowly increase the pressure, squeezing the ball as much as you can without causing pain. Think of bringing the tips of your fingers into the middle of your palm. All of your finger joints should bend when doing this exercise. Hold your squeeze for 10 seconds, then relax. Repeat this exercise 5-10 times with each hand. Contact a health care provider if: Your hand pain or discomfort gets much worse when you do an exercise. Your hand pain or discomfort does not improve within 2 hours after you exercise. If you have either of these problems, stop doing these exercises right away. Do not do them again unless your provider says that you can. Get help right away if: You develop sudden, severe hand pain or swelling. If this happens, stop doing these exercises right away. Do not do them again unless your provider says that you can. This information is not intended to replace advice given to you by your health care provider. Make sure you discuss any questions you have with your health care provider. Document Revised: 02/19/2022 Document Reviewed: 02/19/2022 Elsevier Patient Education  2024 [employer] Cervical Strain and Sprain Rehab Ask your health care provider which exercises are safe for you. Do exercises exactly as told by your health care provider and adjust them as directed. It is normal to feel mild stretching, pulling, tightness, or discomfort as you do these  exercises. Stop right away if you feel sudden pain or your pain gets worse. Do not begin these exercises until told by your health care provider. Stretching and range-of-motion exercises Cervical side bending  Using good posture, sit on a stable chair or stand up. Without moving your shoulders, slowly tilt your left / right ear to your shoulder until you feel a stretch in the neck muscles on the opposite side. You should be looking straight ahead. Hold for __________ seconds. Repeat with the other side of your neck. Repeat __________ times. Complete this exercise __________ times a day. Cervical rotation  Using good posture, sit on a stable chair or stand up. Slowly turn your head to the side as if you are looking over your left / right shoulder. Keep your eyes level with the ground. Stop when you feel a stretch along the side and the back of your neck. Hold for __________ seconds. Repeat this by turning to your other side. Repeat __________ times. Complete this exercise __________ times a day. Thoracic extension and pectoral stretch  Roll a towel or a small blanket so it is about 4 inches (10 cm) in diameter. Lie down on your back on a firm surface. Put the towel in the middle of your back across your spine. It should not be under your shoulder blades. Put your hands behind your head and let your elbows fall out to your sides. Hold for __________ seconds. Repeat __________ times. Complete this exercise __________ times a day. Strengthening exercises Upper cervical flexion  Lie on your back with a thin pillow behind your head or a small, rolled-up towel under your neck. Gently tuck your chin toward your chest and nod your head down to look toward your feet. Do not lift your head off the pillow. Hold for __________ seconds. Release the tension slowly. Relax your neck muscles completely before you repeat this exercise. Repeat __________ times. Complete this exercise __________ times a  day. Cervical extension  Stand about 6 inches (15 cm) away from a wall, with your back facing the wall. Place a soft object, about 6-8 inches (15-20 cm) in diameter, between the back of your head and the wall. A soft object could be a small pillow, a ball, or a folded towel. Gently tilt your head back and press into the soft object. Keep your jaw and forehead relaxed. Hold for __________ seconds. Release the tension slowly. Relax your neck muscles completely before you repeat this exercise. Repeat __________ times. Complete this exercise __________ times a day. Posture and body mechanics Body mechanics refer to the movements and positions of your body while you do your daily activities. Posture is part of body mechanics. Good posture and healthy body mechanics can help to relieve stress in your body's tissues and joints. Good posture means that your spine is in its natural S-curve position (your spine is neutral), your shoulders are pulled back slightly, and your head is not tipped forward. The following are general guidelines for using improved posture and body mechanics in your everyday activities. Sitting  When sitting, keep your spine neutral and keep your feet flat on the floor. Use a footrest, if needed, and keep your thighs parallel to the floor. Avoid rounding your shoulders. Avoid tilting your head forward. When working at a desk or a computer, keep your desk at a height where your hands are slightly lower than your elbows. Slide your chair under your desk so you are close enough to maintain good posture. When working at a computer, place your monitor at a height where you are looking straight ahead and you do not have to tilt your head forward or downward to look at the screen. Standing  When standing, keep your spine neutral and keep your feet about hip-width apart. Keep a slight bend in your knees. Your ears, shoulders, and hips should line up. When you do a task in which you stand in  one place for a long time, place one foot up on a stable object that is 2-4 inches (5-10 cm) high, such as a footstool. This helps keep your spine neutral. Resting When lying down and resting, avoid positions that are most painful for you. Try to support your neck in a neutral position. You can use a contour pillow or a small rolled-up towel. Your pillow should support your neck but not push on it. This information is not intended to replace advice given to you by your health care provider. Make sure you discuss any questions you have with your health care provider. Document Revised: 06/10/2022  Document Reviewed: 08/27/2021 Elsevier Patient Education  2024 [employer] Thoracic Strain Rehab Ask your health care provider which exercises are safe for you. Do exercises exactly as told by your provider and adjust them as directed. It is normal to feel mild stretching, pulling, tightness, or discomfort as you do these exercises. Stop right away if you feel sudden pain or your pain gets worse. Do not begin these exercises until told by your provider. Stretching and range-of-motion exercise This exercise warms up your muscles and joints and improves the movement and flexibility of your back and shoulders. This exercise also helps to relieve pain. Chest and spine stretch  Lie down on your back on a firm surface. Roll a towel or a small blanket so it is about 4 inches (10 cm) in diameter. Put the towel under the middle of your back so it is under your spine, but not under your shoulder blades. Put your hands behind your head and let your elbows fall to your sides. This will increase your stretch. Take a deep breath (inhale). Hold for __________ seconds. Relax after you breathe out (exhale). Repeat __________ times. Complete this exercise __________ times a day. Strengthening exercises These exercises build strength and endurance in your back and your shoulder blade muscles. Endurance is the ability to  use your muscles for a long time, even after they get tired. Alternating arm and leg raises  Get on your hands and knees on a firm surface. If you are on a hard floor, you may want to use padding, such as an exercise mat, to cushion your knees. Line up your arms and legs. Your hands should be directly below your shoulders, and your knees should be directly below your hips. Lift your left leg behind you. At the same time, raise your right arm and straighten it in front of you. Do not lift your leg higher than your hip. Do not lift your arm higher than your shoulder. Keep your abdominal and back muscles tight. Keep your hips facing the ground. Do not arch your back. Carefully stay balanced. Do not hold your breath. Hold for __________ seconds. Slowly return to the starting position and repeat with your right leg and your left arm. Repeat __________ times. Complete this exercise __________ times a day. Straight arm rows This exercise is also called the shoulder extension exercise. Stand with your feet shoulder width apart. Secure an exercise band to a stable object in front of you so the band is at or above shoulder height. Hold one end of the exercise band in each hand. Straighten your elbows and lift your hands up to shoulder height. Step back, away from the secured end of the exercise band, until the band stretches. Squeeze your shoulder blades together and pull your hands down to the sides of your thighs. Stop when your hands are straight down by your sides. This is shoulder extension. Do not let your hands go behind your body. Hold for __________ seconds. Slowly return to the starting position. Repeat __________ times. Complete this exercise __________ times a day. Rowing scapular retraction This is an exercise in which the shoulder blades (scapulae) are pulled toward each other (retraction). Sit in a stable chair without armrests, or stand up. Secure an exercise band to a stable  object in front of you so the band is at shoulder height. Hold one end of the exercise band in each hand. Your palms should face toward each other. Bring your arms out straight in front  of you. Step back, away from the secured end of the exercise band, until the band stretches. Pull the band backward. As you do this, bend your elbows and squeeze your shoulder blades together, but avoid letting the rest of your body move. Do not shrug your shoulders upward while you do this. Stop when your elbows are at your sides or slightly behind your body. Hold for __________ seconds. Slowly straighten your arms to return to the starting position. Repeat __________ times. Complete this exercise __________ times a day. Posture and body mechanics Good posture and healthy body mechanics can help to relieve stress in your body's tissues and joints. Body mechanics refers to the movements and positions of your body while you do your daily activities. Posture is part of body mechanics. Good posture means: Your spine is in its natural S-curve position (neutral). Your shoulders are pulled back slightly. Your head is not tipped forward. Follow these guidelines to improve your posture and body mechanics in your everyday activities. Standing  When standing, keep your spine neutral and your feet about hip width apart. Keep a slight bend in your knees. Your ears, shoulders, and hips should line up with each other. When you do a task in which you lean forward while standing in one place for a long time, place one foot up on a stable object that is 2-4 inches (5-10 cm) high, such as a footstool. This helps keep your spine neutral. Sitting  When sitting, keep your spine neutral and keep your feet flat on the floor. Use a footrest if needed. Keep your thighs parallel to the floor. Avoid rounding your shoulders, and avoid tilting your head forward. When working at a desk or a computer, keep your desk at a height where your  hands are slightly lower than your elbows. Slide your chair under your desk so you are close enough to maintain good posture. When working at a computer, place your monitor at a height where you are looking straight ahead and you do not have to tilt your head forward or downward to look at the screen. Resting When lying down and resting, avoid positions that are most painful for you. If you have pain with activities such as sitting, bending, stooping, or squatting (flexion-basedactivities), lie in a position in which your body does not bend very much. For example, avoid curling up on your side with your arms and knees near your chest (fetal position). If you have pain with activities such as standing for a long time or reaching with your arms (extension-basedactivities), lie with your spine in a neutral position and bend your knees slightly. Try the following positions: Lie on your side with a pillow between your knees. Lie on your back with a pillow under your knees.  Lifting  When lifting objects, keep your feet at least shoulder width apart and tighten your abdominal muscles. Bend your knees and hips and keep your spine neutral. It is important to lift using the strength of your legs, not your back. Do not lock your knees straight out. Always ask for help to lift heavy or awkward objects. This information is not intended to replace advice given to you by your health care provider. Make sure you discuss any questions you have with your health care provider. Document Revised: 09/24/2021 Document Reviewed: 09/24/2021 Elsevier Patient Education  2024 [employer].

## 2023-02-07 LAB — ANA: Anti Nuclear Antibody (ANA): NEGATIVE

## 2023-02-07 LAB — CYCLIC CITRUL PEPTIDE ANTIBODY, IGG: Cyclic Citrullin Peptide Ab: <16 U

## 2023-02-07 LAB — SEDIMENTATION RATE: Sed Rate: 11 mm/h (ref 0–20)

## 2023-02-07 LAB — RHEUMATOID FACTOR: Rheumatoid fact SerPl-aCnc: <10 [IU]/mL (ref <14)

## 2023-02-09 NOTE — Progress Notes (Signed)
Sed rate normal, RF negative, anti-CCP negative, ANA negative.  All autoimmune labs are negative.  Please forward results to her PCP.

## 2023-02-17 ENCOUNTER — Encounter: Payer: Self-pay | Admitting: Family Medicine

## 2023-03-07 DIAGNOSIS — R197 Diarrhea, unspecified: Secondary | ICD-10-CM | POA: Diagnosis not present

## 2023-03-07 DIAGNOSIS — F411 Generalized anxiety disorder: Secondary | ICD-10-CM | POA: Diagnosis not present

## 2023-03-07 DIAGNOSIS — F900 Attention-deficit hyperactivity disorder, predominantly inattentive type: Secondary | ICD-10-CM | POA: Diagnosis not present

## 2023-03-07 DIAGNOSIS — F331 Major depressive disorder, recurrent, moderate: Secondary | ICD-10-CM | POA: Diagnosis not present

## 2023-03-21 DIAGNOSIS — F900 Attention-deficit hyperactivity disorder, predominantly inattentive type: Secondary | ICD-10-CM | POA: Diagnosis not present

## 2023-03-21 DIAGNOSIS — F331 Major depressive disorder, recurrent, moderate: Secondary | ICD-10-CM | POA: Diagnosis not present

## 2023-03-21 DIAGNOSIS — F411 Generalized anxiety disorder: Secondary | ICD-10-CM | POA: Diagnosis not present

## 2023-04-02 DIAGNOSIS — F411 Generalized anxiety disorder: Secondary | ICD-10-CM | POA: Diagnosis not present

## 2023-04-02 DIAGNOSIS — F331 Major depressive disorder, recurrent, moderate: Secondary | ICD-10-CM | POA: Diagnosis not present

## 2023-04-02 DIAGNOSIS — F900 Attention-deficit hyperactivity disorder, predominantly inattentive type: Secondary | ICD-10-CM | POA: Diagnosis not present

## 2023-04-13 DIAGNOSIS — M25512 Pain in left shoulder: Secondary | ICD-10-CM | POA: Diagnosis not present

## 2023-04-17 DIAGNOSIS — F411 Generalized anxiety disorder: Secondary | ICD-10-CM | POA: Diagnosis not present

## 2023-04-17 DIAGNOSIS — F900 Attention-deficit hyperactivity disorder, predominantly inattentive type: Secondary | ICD-10-CM | POA: Diagnosis not present

## 2023-04-17 DIAGNOSIS — F331 Major depressive disorder, recurrent, moderate: Secondary | ICD-10-CM | POA: Diagnosis not present

## 2023-04-22 DIAGNOSIS — M25512 Pain in left shoulder: Secondary | ICD-10-CM | POA: Diagnosis not present

## 2023-04-23 DIAGNOSIS — M25512 Pain in left shoulder: Secondary | ICD-10-CM | POA: Diagnosis not present

## 2023-05-02 DIAGNOSIS — F900 Attention-deficit hyperactivity disorder, predominantly inattentive type: Secondary | ICD-10-CM | POA: Diagnosis not present

## 2023-05-02 DIAGNOSIS — F411 Generalized anxiety disorder: Secondary | ICD-10-CM | POA: Diagnosis not present

## 2023-05-02 DIAGNOSIS — F331 Major depressive disorder, recurrent, moderate: Secondary | ICD-10-CM | POA: Diagnosis not present

## 2023-05-09 DIAGNOSIS — F900 Attention-deficit hyperactivity disorder, predominantly inattentive type: Secondary | ICD-10-CM | POA: Diagnosis not present

## 2023-05-09 DIAGNOSIS — F331 Major depressive disorder, recurrent, moderate: Secondary | ICD-10-CM | POA: Diagnosis not present

## 2023-05-09 DIAGNOSIS — F411 Generalized anxiety disorder: Secondary | ICD-10-CM | POA: Diagnosis not present

## 2023-05-13 DIAGNOSIS — F331 Major depressive disorder, recurrent, moderate: Secondary | ICD-10-CM | POA: Diagnosis not present

## 2023-05-13 DIAGNOSIS — F900 Attention-deficit hyperactivity disorder, predominantly inattentive type: Secondary | ICD-10-CM | POA: Diagnosis not present

## 2023-05-13 DIAGNOSIS — F411 Generalized anxiety disorder: Secondary | ICD-10-CM | POA: Diagnosis not present

## 2023-05-16 DIAGNOSIS — M25512 Pain in left shoulder: Secondary | ICD-10-CM | POA: Diagnosis not present

## 2023-06-02 DIAGNOSIS — J069 Acute upper respiratory infection, unspecified: Secondary | ICD-10-CM | POA: Diagnosis not present

## 2023-06-09 DIAGNOSIS — F900 Attention-deficit hyperactivity disorder, predominantly inattentive type: Secondary | ICD-10-CM | POA: Diagnosis not present

## 2023-06-09 DIAGNOSIS — F331 Major depressive disorder, recurrent, moderate: Secondary | ICD-10-CM | POA: Diagnosis not present

## 2023-06-09 DIAGNOSIS — F411 Generalized anxiety disorder: Secondary | ICD-10-CM | POA: Diagnosis not present

## 2023-06-09 DIAGNOSIS — G4700 Insomnia, unspecified: Secondary | ICD-10-CM | POA: Diagnosis not present

## 2023-06-10 DIAGNOSIS — M542 Cervicalgia: Secondary | ICD-10-CM | POA: Diagnosis not present

## 2023-06-10 DIAGNOSIS — M25512 Pain in left shoulder: Secondary | ICD-10-CM | POA: Diagnosis not present

## 2023-06-13 DIAGNOSIS — F900 Attention-deficit hyperactivity disorder, predominantly inattentive type: Secondary | ICD-10-CM | POA: Diagnosis not present

## 2023-06-13 DIAGNOSIS — F331 Major depressive disorder, recurrent, moderate: Secondary | ICD-10-CM | POA: Diagnosis not present

## 2023-06-13 DIAGNOSIS — F411 Generalized anxiety disorder: Secondary | ICD-10-CM | POA: Diagnosis not present

## 2023-06-13 DIAGNOSIS — G4700 Insomnia, unspecified: Secondary | ICD-10-CM | POA: Diagnosis not present

## 2023-06-25 DIAGNOSIS — M25512 Pain in left shoulder: Secondary | ICD-10-CM | POA: Diagnosis not present

## 2023-06-25 DIAGNOSIS — M542 Cervicalgia: Secondary | ICD-10-CM | POA: Diagnosis not present

## 2023-06-30 DIAGNOSIS — M542 Cervicalgia: Secondary | ICD-10-CM | POA: Diagnosis not present

## 2023-06-30 DIAGNOSIS — M25512 Pain in left shoulder: Secondary | ICD-10-CM | POA: Diagnosis not present

## 2023-07-21 ENCOUNTER — Encounter: Payer: Self-pay | Admitting: Family Medicine

## 2023-07-21 DIAGNOSIS — L659 Nonscarring hair loss, unspecified: Secondary | ICD-10-CM

## 2023-07-23 ENCOUNTER — Telehealth: Payer: Self-pay | Admitting: Rheumatology

## 2023-07-23 DIAGNOSIS — G8929 Other chronic pain: Secondary | ICD-10-CM

## 2023-07-23 DIAGNOSIS — M79641 Pain in right hand: Secondary | ICD-10-CM

## 2023-07-23 DIAGNOSIS — M26621 Arthralgia of right temporomandibular joint: Secondary | ICD-10-CM

## 2023-07-23 DIAGNOSIS — M546 Pain in thoracic spine: Secondary | ICD-10-CM

## 2023-07-23 DIAGNOSIS — Z8261 Family history of arthritis: Secondary | ICD-10-CM

## 2023-07-23 DIAGNOSIS — M542 Cervicalgia: Secondary | ICD-10-CM

## 2023-07-23 DIAGNOSIS — M79642 Pain in left hand: Secondary | ICD-10-CM

## 2023-07-23 NOTE — Telephone Encounter (Signed)
 Referral placed.

## 2023-07-23 NOTE — Telephone Encounter (Signed)
 Yes, thank you.

## 2023-07-23 NOTE — Telephone Encounter (Signed)
 Patient contacted the office requesting a referral to Rheumatology.  Patient request referral for transfer of care/ pt moved.  Patient's preferred area for referral is: [facility]    [address], [place], [place] [address]

## 2023-07-28 DIAGNOSIS — M542 Cervicalgia: Secondary | ICD-10-CM | POA: Diagnosis not present

## 2023-07-28 DIAGNOSIS — M25512 Pain in left shoulder: Secondary | ICD-10-CM | POA: Diagnosis not present

## 2023-08-03 DIAGNOSIS — M25512 Pain in left shoulder: Secondary | ICD-10-CM | POA: Diagnosis not present

## 2023-08-15 DIAGNOSIS — F900 Attention-deficit hyperactivity disorder, predominantly inattentive type: Secondary | ICD-10-CM | POA: Diagnosis not present

## 2023-08-15 DIAGNOSIS — F331 Major depressive disorder, recurrent, moderate: Secondary | ICD-10-CM | POA: Diagnosis not present

## 2023-08-15 DIAGNOSIS — G4700 Insomnia, unspecified: Secondary | ICD-10-CM | POA: Diagnosis not present

## 2023-08-15 DIAGNOSIS — F411 Generalized anxiety disorder: Secondary | ICD-10-CM | POA: Diagnosis not present

## 2023-08-19 ENCOUNTER — Encounter: Payer: Self-pay | Admitting: Family Medicine

## 2023-08-19 ENCOUNTER — Ambulatory Visit: Admitting: Family Medicine

## 2023-08-19 VITALS — BP 115/75 | HR 86 | Ht 65.0 in | Wt 146.6 lb

## 2023-08-19 DIAGNOSIS — F331 Major depressive disorder, recurrent, moderate: Secondary | ICD-10-CM | POA: Diagnosis not present

## 2023-08-19 DIAGNOSIS — J302 Other seasonal allergic rhinitis: Secondary | ICD-10-CM | POA: Diagnosis not present

## 2023-08-19 DIAGNOSIS — Z0000 Encounter for general adult medical examination without abnormal findings: Secondary | ICD-10-CM | POA: Diagnosis not present

## 2023-08-19 DIAGNOSIS — Z7184 Encounter for health counseling related to travel: Secondary | ICD-10-CM

## 2023-08-19 DIAGNOSIS — Z91018 Allergy to other foods: Secondary | ICD-10-CM | POA: Diagnosis not present

## 2023-08-19 DIAGNOSIS — M25512 Pain in left shoulder: Secondary | ICD-10-CM | POA: Diagnosis not present

## 2023-08-19 DIAGNOSIS — G4700 Insomnia, unspecified: Secondary | ICD-10-CM | POA: Diagnosis not present

## 2023-08-19 DIAGNOSIS — F411 Generalized anxiety disorder: Secondary | ICD-10-CM | POA: Diagnosis not present

## 2023-08-19 DIAGNOSIS — F900 Attention-deficit hyperactivity disorder, predominantly inattentive type: Secondary | ICD-10-CM | POA: Diagnosis not present

## 2023-08-19 MED ORDER — CETIRIZINE HCL 10 MG PO TABS: 10.0000 mg | ORAL_TABLET | Freq: Every day | ORAL | 1 refills | Status: AC | PRN

## 2023-08-19 MED ORDER — EPINEPHRINE 0.3 MG/0.3ML IJ SOAJ: 0.3000 mg | INTRAMUSCULAR | 0 refills | Status: AC | PRN

## 2023-08-19 MED ORDER — ONDANSETRON 4 MG PO TBDP: 4.0000 mg | ORAL_TABLET | Freq: Three times a day (TID) | ORAL | 0 refills | Status: DC | PRN

## 2023-08-19 NOTE — Patient Instructions (Addendum)
 It was great to see you again today.  Have a great trip  Sent in refill of zyrtec , epipen , and some zofran to take with you in case you get a stomach bug overseas.  Let me know if you want to shadow!  [email or web address]  Be well, Dr. Donah

## 2023-08-21 DIAGNOSIS — Z0000 Encounter for general adult medical examination without abnormal findings: Secondary | ICD-10-CM | POA: Insufficient documentation

## 2023-08-21 DIAGNOSIS — M25512 Pain in left shoulder: Secondary | ICD-10-CM | POA: Diagnosis not present

## 2023-08-21 DIAGNOSIS — M7552 Bursitis of left shoulder: Secondary | ICD-10-CM | POA: Diagnosis not present

## 2023-08-21 NOTE — Assessment & Plan Note (Signed)
 Plans to get pap smear at student health at [facility] [employer] [facility] this fall, she will send records to me via mychart

## 2023-08-21 NOTE — Assessment & Plan Note (Signed)
 Refill epi pen

## 2023-08-21 NOTE — Progress Notes (Signed)
  Date of Visit: 08/19/2023   SUBJECTIVE:   HPI:  Sherry Griffin presents today for pre-travel consultation.  She is going to [place] within a week for a study abroad trip. Will not be in any forest areas. UTD on immunizations including COVID.  She is planning to apply to medical school. Senior at [employer]  Needs refill of epipen  and zyrtec    OBJECTIVE:   BP 115/75   Pulse 86   Ht 5' 5 (1.651 m)   Wt 146 lb 9.6 oz (66.5 kg)   SpO2 100%   BMI 24.40 kg/m  Gen: no acute distress, pleasant cooperative HEENT: normocephalic, atraumatic Resp: normal work of breathing on room air  Psych: normal range of affect, well groomed, speech normal in rate and volume, normal eye contact   ASSESSMENT/PLAN:   Assessment & Plan Travel advice encounter Reviewed CDC travel advisories for [place]. Patient is current on all vaccines required for travel there. Discussed possibility of vaccination for Tickborne Encephalitis, which would be recommended if patient were going to be doing extensive outdoor activities that might result in exposure to ticks. She is not planning on hiking or being in forested areas and has elected to forego this vaccine, which I think is very reasonable. Sent in rx for zofran so she has this on hand for her trip if needed. Nut allergy Refill epipen  Seasonal allergies Stable, refill zyrtec  Routine adult health maintenance Plans to get pap smear at student health at [employer] this fall, she will send records to me via [place] [name], MD [facility]

## 2023-08-21 NOTE — Assessment & Plan Note (Signed)
Stable, refill zyrtec

## 2023-10-02 DIAGNOSIS — S39012A Strain of muscle, fascia and tendon of lower back, initial encounter: Secondary | ICD-10-CM | POA: Diagnosis not present

## 2023-10-02 DIAGNOSIS — Z79899 Other long term (current) drug therapy: Secondary | ICD-10-CM | POA: Diagnosis not present

## 2023-10-10 DIAGNOSIS — G4700 Insomnia, unspecified: Secondary | ICD-10-CM | POA: Diagnosis not present

## 2023-10-10 DIAGNOSIS — F331 Major depressive disorder, recurrent, moderate: Secondary | ICD-10-CM | POA: Diagnosis not present

## 2023-10-10 DIAGNOSIS — F411 Generalized anxiety disorder: Secondary | ICD-10-CM | POA: Diagnosis not present

## 2023-10-10 DIAGNOSIS — F900 Attention-deficit hyperactivity disorder, predominantly inattentive type: Secondary | ICD-10-CM | POA: Diagnosis not present

## 2023-10-13 DIAGNOSIS — M25512 Pain in left shoulder: Secondary | ICD-10-CM | POA: Diagnosis not present

## 2023-10-23 DIAGNOSIS — S29019A Strain of muscle and tendon of unspecified wall of thorax, initial encounter: Secondary | ICD-10-CM | POA: Diagnosis not present

## 2023-10-23 DIAGNOSIS — S39012A Strain of muscle, fascia and tendon of lower back, initial encounter: Secondary | ICD-10-CM | POA: Diagnosis not present

## 2023-11-06 DIAGNOSIS — M542 Cervicalgia: Secondary | ICD-10-CM | POA: Diagnosis not present

## 2023-11-06 DIAGNOSIS — H539 Unspecified visual disturbance: Secondary | ICD-10-CM | POA: Diagnosis not present

## 2023-11-06 DIAGNOSIS — R202 Paresthesia of skin: Secondary | ICD-10-CM | POA: Diagnosis not present

## 2023-11-06 DIAGNOSIS — R200 Anesthesia of skin: Secondary | ICD-10-CM | POA: Diagnosis not present

## 2023-11-06 DIAGNOSIS — R42 Dizziness and giddiness: Secondary | ICD-10-CM | POA: Diagnosis not present

## 2023-11-12 DIAGNOSIS — M549 Dorsalgia, unspecified: Secondary | ICD-10-CM | POA: Diagnosis not present

## 2023-11-12 DIAGNOSIS — S39012D Strain of muscle, fascia and tendon of lower back, subsequent encounter: Secondary | ICD-10-CM | POA: Diagnosis not present

## 2023-12-22 DIAGNOSIS — M5416 Radiculopathy, lumbar region: Secondary | ICD-10-CM | POA: Diagnosis not present

## 2023-12-31 DIAGNOSIS — M5416 Radiculopathy, lumbar region: Secondary | ICD-10-CM | POA: Diagnosis not present

## 2023-12-31 DIAGNOSIS — M129 Arthropathy, unspecified: Secondary | ICD-10-CM | POA: Diagnosis not present

## 2023-12-31 DIAGNOSIS — M4316 Spondylolisthesis, lumbar region: Secondary | ICD-10-CM | POA: Diagnosis not present

## 2023-12-31 DIAGNOSIS — M48061 Spinal stenosis, lumbar region without neurogenic claudication: Secondary | ICD-10-CM | POA: Diagnosis not present

## 2023-12-31 DIAGNOSIS — M5136 Other intervertebral disc degeneration, lumbar region with discogenic back pain only: Secondary | ICD-10-CM | POA: Diagnosis not present

## 2023-12-31 DIAGNOSIS — M47816 Spondylosis without myelopathy or radiculopathy, lumbar region: Secondary | ICD-10-CM | POA: Diagnosis not present

## 2024-01-19 DIAGNOSIS — M47816 Spondylosis without myelopathy or radiculopathy, lumbar region: Secondary | ICD-10-CM | POA: Diagnosis not present

## 2024-01-28 ENCOUNTER — Encounter: Payer: Self-pay | Admitting: Family Medicine

## 2024-01-29 DIAGNOSIS — M25551 Pain in right hip: Secondary | ICD-10-CM | POA: Diagnosis not present

## 2024-01-29 DIAGNOSIS — M25561 Pain in right knee: Secondary | ICD-10-CM | POA: Diagnosis not present

## 2024-01-29 DIAGNOSIS — M533 Sacrococcygeal disorders, not elsewhere classified: Secondary | ICD-10-CM | POA: Diagnosis not present

## 2024-01-29 DIAGNOSIS — M25562 Pain in left knee: Secondary | ICD-10-CM | POA: Diagnosis not present

## 2024-01-29 DIAGNOSIS — G609 Hereditary and idiopathic neuropathy, unspecified: Secondary | ICD-10-CM | POA: Diagnosis not present

## 2024-01-29 DIAGNOSIS — M25542 Pain in joints of left hand: Secondary | ICD-10-CM | POA: Diagnosis not present

## 2024-01-29 DIAGNOSIS — M2550 Pain in unspecified joint: Secondary | ICD-10-CM | POA: Diagnosis not present

## 2024-01-29 DIAGNOSIS — M25541 Pain in joints of right hand: Secondary | ICD-10-CM | POA: Diagnosis not present

## 2024-01-29 DIAGNOSIS — M25552 Pain in left hip: Secondary | ICD-10-CM | POA: Diagnosis not present

## 2024-02-10 ENCOUNTER — Encounter: Payer: Self-pay | Admitting: Family Medicine

## 2024-02-10 ENCOUNTER — Ambulatory Visit: Admitting: Family Medicine

## 2024-02-10 ENCOUNTER — Other Ambulatory Visit (HOSPITAL_COMMUNITY)
Admission: RE | Admit: 2024-02-10 | Discharge: 2024-02-10 | Disposition: A | Discharge status: Home / Self Care | Source: Ambulatory Visit | Attending: Family Medicine | Admitting: Family Medicine

## 2024-02-10 VITALS — BP 117/67 | HR 89 | Ht 65.0 in | Wt 155.0 lb

## 2024-02-10 DIAGNOSIS — Z124 Encounter for screening for malignant neoplasm of cervix: Secondary | ICD-10-CM | POA: Diagnosis not present

## 2024-02-10 DIAGNOSIS — Z23 Encounter for immunization: Secondary | ICD-10-CM | POA: Diagnosis not present

## 2024-02-10 DIAGNOSIS — Z113 Encounter for screening for infections with a predominantly sexual mode of transmission: Secondary | ICD-10-CM | POA: Diagnosis not present

## 2024-02-10 DIAGNOSIS — Z0000 Encounter for general adult medical examination without abnormal findings: Secondary | ICD-10-CM
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
## 2024-02-10 NOTE — Patient Instructions (Addendum)
 It was great to see you again today.  Pap smear today Flu shot and meningitis vaccine today  Be well, Dr. Donah

## 2024-02-11 NOTE — Assessment & Plan Note (Signed)
 Routine wellness visit for a 23 year old female. Discussed STD screening options and birth control methods.  - Collect Pap smear. - Conducted STD screening including gonorrhea, chlamydia, and trichomonas (genital and throat swabs) - Offered blood work for HIV and syphilis, future order placed as lab is closed - Discussed birth control options, does not desire any contraception at this time - flu shot and Men B today

## 2024-02-13 ENCOUNTER — Ambulatory Visit: Payer: Self-pay | Admitting: Family Medicine

## 2024-02-13 LAB — CERVICOVAGINAL ANCILLARY ONLY
Chlamydia: NEGATIVE
Comment: NEGATIVE
Comment: NORMAL
Neisseria Gonorrhea: NEGATIVE

## 2024-02-13 LAB — CYTOLOGY - PAP
Adequacy: ABSENT
Chlamydia: NEGATIVE
Comment: NEGATIVE
Comment: NEGATIVE
Comment: NORMAL
Diagnosis: NEGATIVE
Diagnosis: REACTIVE
Neisseria Gonorrhea: NEGATIVE
Trichomonas: NEGATIVE

## 2024-02-19 ENCOUNTER — Encounter: Payer: Self-pay | Admitting: Family Medicine

## 2024-02-19 DIAGNOSIS — G8921 Chronic pain due to trauma: Secondary | ICD-10-CM
